# Patient Record
Sex: Male | Born: 1965 | Race: White | Hispanic: No | Marital: Married | Smoking: Never smoker
Health system: Southern US, Community
[De-identification: ages and names within clinical notes are randomized; demographics above are authoritative.]

## PROBLEM LIST (undated history)

## (undated) DIAGNOSIS — E785 Hyperlipidemia, unspecified: Secondary | ICD-10-CM

## (undated) DIAGNOSIS — M1990 Unspecified osteoarthritis, unspecified site: Secondary | ICD-10-CM

## (undated) DIAGNOSIS — R011 Cardiac murmur, unspecified: Secondary | ICD-10-CM

## (undated) DIAGNOSIS — I10 Essential (primary) hypertension: Secondary | ICD-10-CM

## (undated) HISTORY — PX: KNEE ARTHROSCOPY: SUR90

---

## 2008-09-22 ENCOUNTER — Emergency Department: Payer: Self-pay | Admitting: Internal Medicine

## 2008-09-23 ENCOUNTER — Emergency Department: Payer: Self-pay | Admitting: Emergency Medicine

## 2009-08-26 IMAGING — CT CT STONE STUDY
1 of 2 series · 15 of 32 positions shown, 19 images · non-contrast
Comparison: None

REASON FOR EXAM: pain l flank
COMMENTS:

PROCEDURE:     CT  - CT ABDOMEN /PELVIS WO (STONE)  - September 22, 2008 [DATE]
RESULT:     Indication: Left flank pain
TECHNIQUE: Multiple axial images from the lung bases to the symphysis pubis
were obtained without oral or intravenous contrast.

[Series 2: soft tissue · axial · 0.73mm/px · z∈[-163,+263]mm · 15 of 160 slices shown, 19 images]
[im 12/160  soft-tissue]
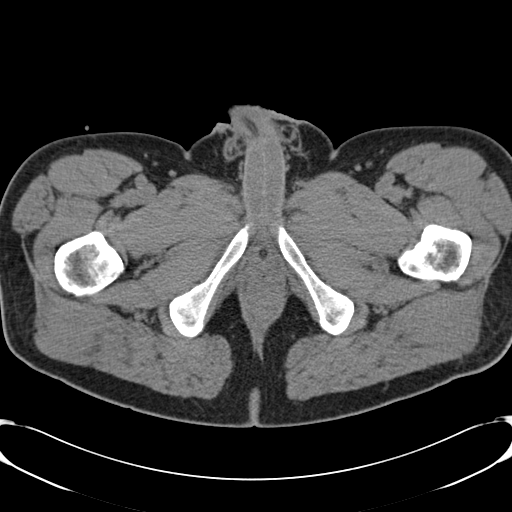
[im 12/160  bone]
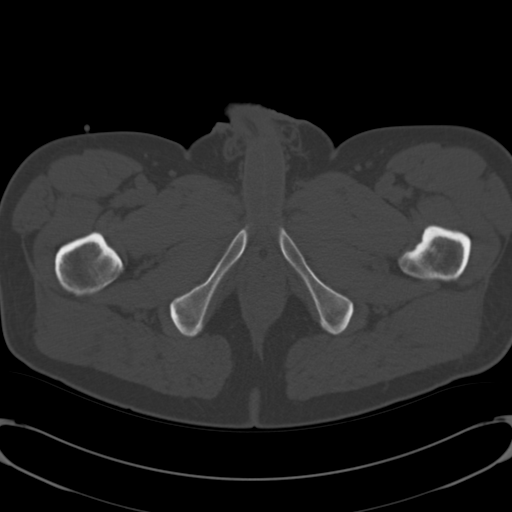
[im 24/160  soft-tissue]
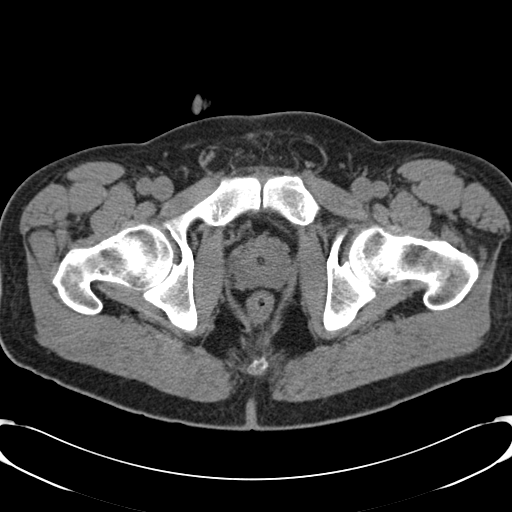
[im 36/160  soft-tissue]
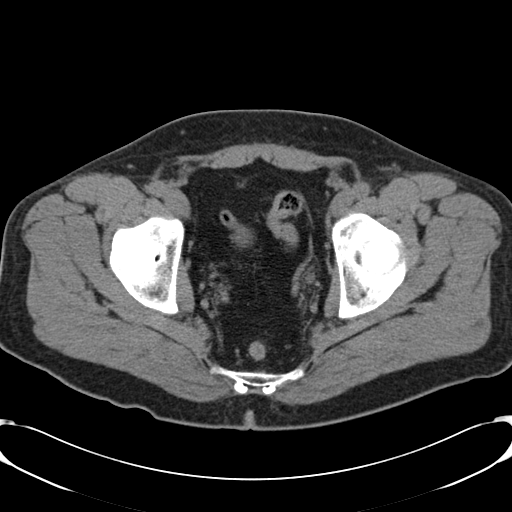
[im 48/160  soft-tissue]
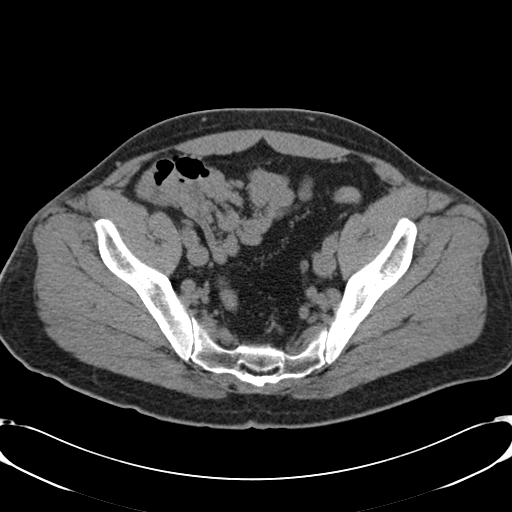
[im 59/160  soft-tissue]
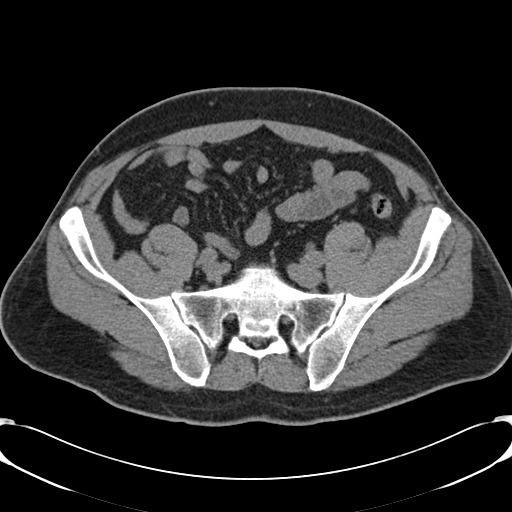
[im 71/160  soft-tissue]
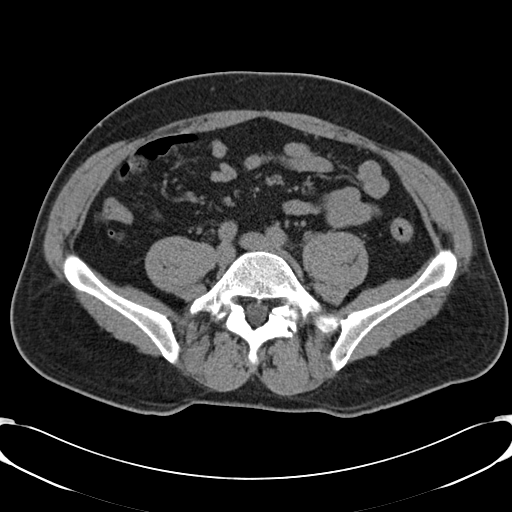
[im 83/160  soft-tissue]
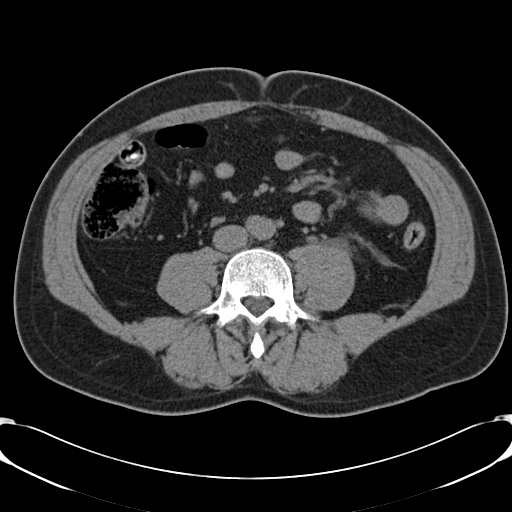
[im 95/160  soft-tissue]
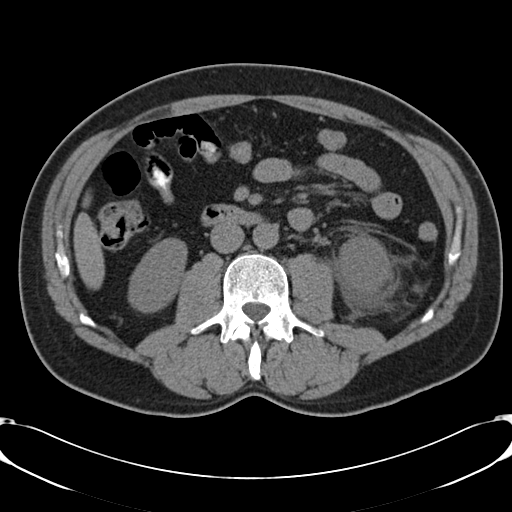
[im 107/160  soft-tissue]
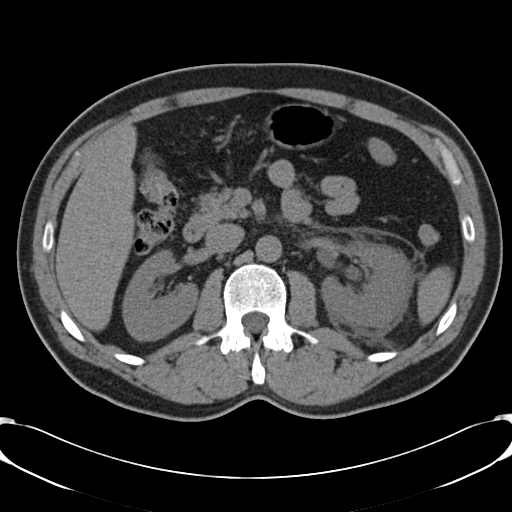
[im 107/160  bone]
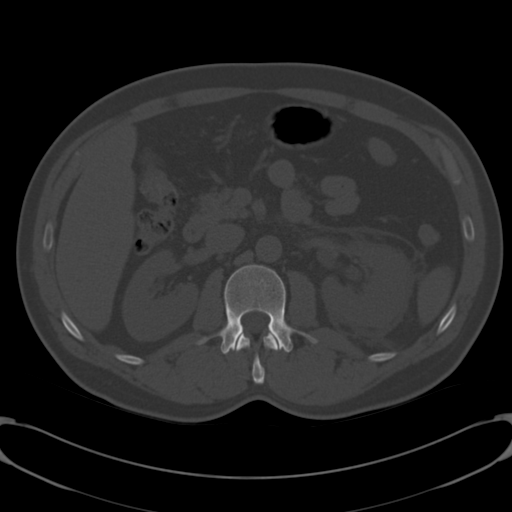
[im 118/160  soft-tissue]
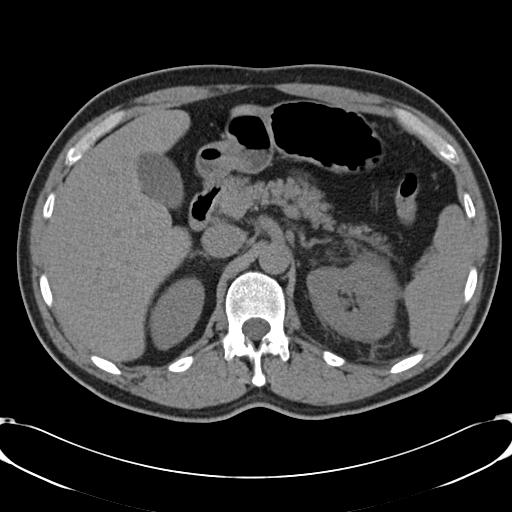
[im 130/160  soft-tissue]
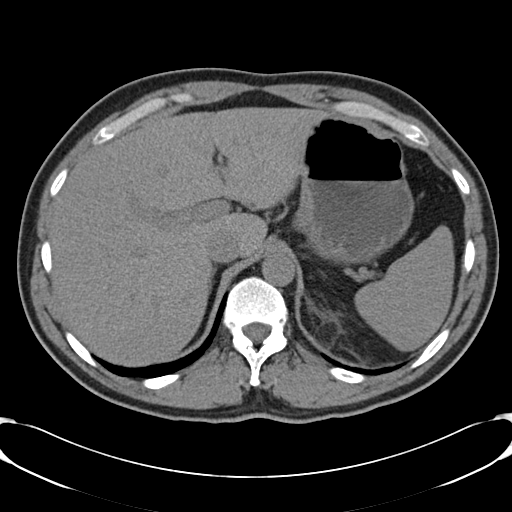
[im 136/160  lung]
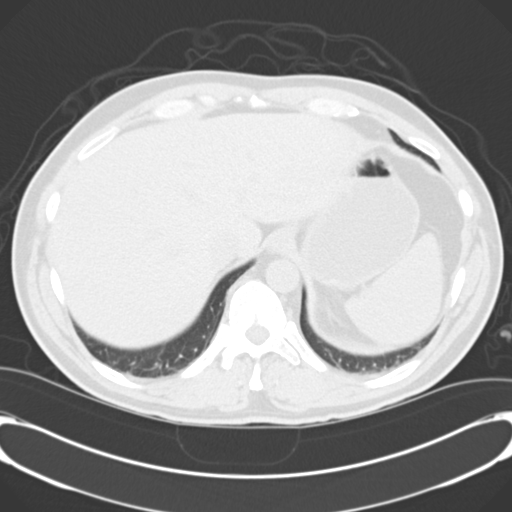
[im 142/160  soft-tissue]
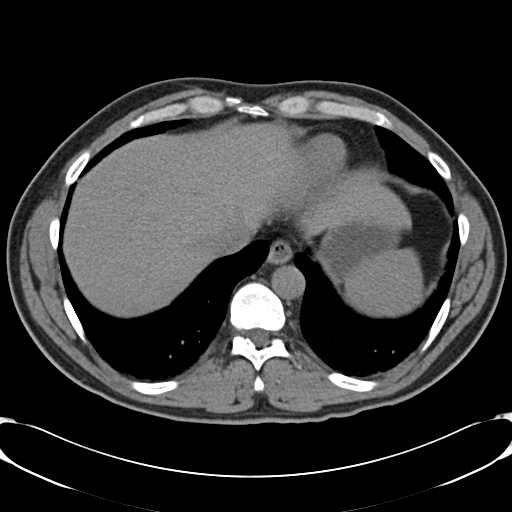
[im 142/160  lung]
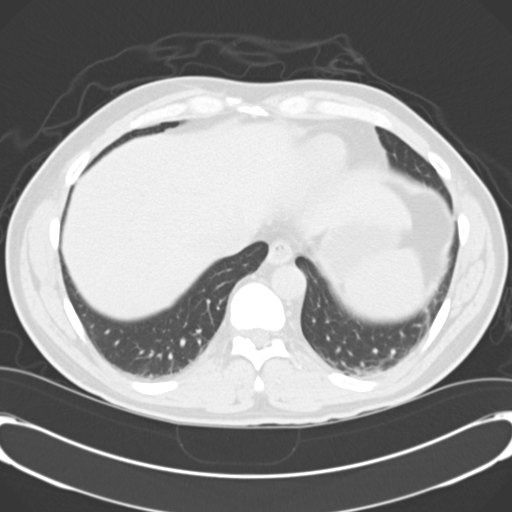
[im 148/160  lung]
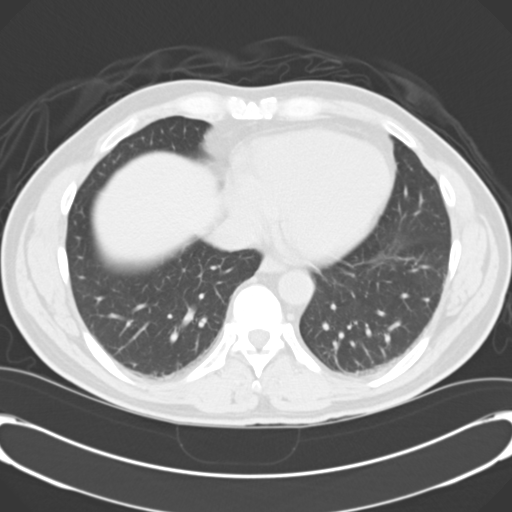
[im 154/160  soft-tissue]
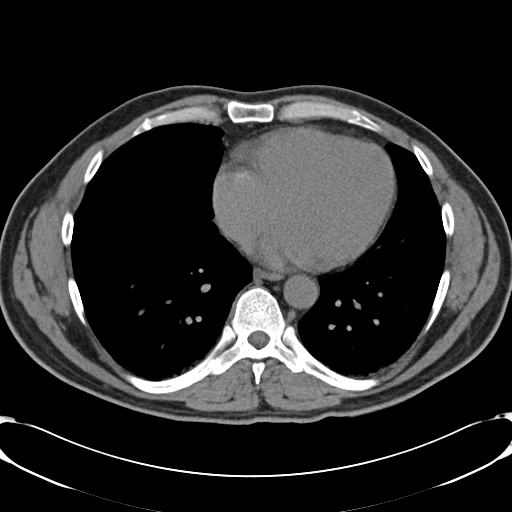
[im 154/160  lung]
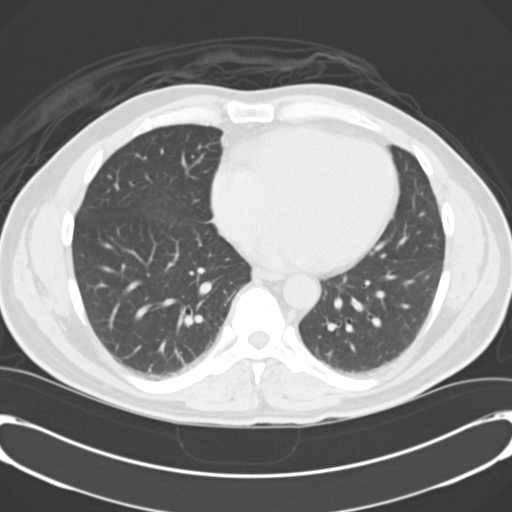

[15 of 32 positions shown; findings below may reference images not displayed]

FINDINGS: The lung bases are clear. There is no pleural or pericardial effusions.

There is a 4 mm left ureterovesicular junction calculus with mild
hydroureter and mild hydronephrosis. There is a significant amount of
perinephric stranding. There is no focal fluid collection to suggest an
abscess.

The right kidney is unremarkable. There is a punctate nonobstructing right
renal calculus. There is no right ureteral calculus. There is a Foley
catheter present within the bladder.

The liver demonstrates no focal abnormality. The gallbladder is
unremarkable. The spleen demonstrates no focal abnormality. The adrenal
glands and pancreas are normal.

The unopacified stomach, duodenum, small intestine, and large intestine are
unremarkable, but evaluation is limited by lack of oral contrast. A normal
caliber appendix is visualized in the right lower quadrant without
periappendiceal inflammatory changes. There is no pneumoperitoneum,
pneumatosis, or portal venous gas. There is no abdominal or pelvic free
fluid. There is no lymphadenopathy.

The abdominal aorta is normal in caliber.

There are mild degenerative changes of the right SI joint..
IMPRESSION: 1. There is a 4 mm left ureterovesicular junction calculus with mild
hydroureter and mild hydronephrosis. There is a significant amount of
perinephric stranding; correlate with urinalysis for pyelonephritis. There
is no focal fluid collection to suggest an abscess.

## 2016-01-12 ENCOUNTER — Ambulatory Visit: Payer: 59 | Attending: Specialist

## 2016-01-12 ENCOUNTER — Ambulatory Visit: Payer: Self-pay

## 2016-01-12 DIAGNOSIS — R0683 Snoring: Secondary | ICD-10-CM | POA: Diagnosis present

## 2016-01-12 DIAGNOSIS — G4761 Periodic limb movement disorder: Secondary | ICD-10-CM | POA: Insufficient documentation

## 2016-01-12 DIAGNOSIS — G4733 Obstructive sleep apnea (adult) (pediatric): Secondary | ICD-10-CM | POA: Diagnosis not present

## 2016-01-12 DIAGNOSIS — I10 Essential (primary) hypertension: Secondary | ICD-10-CM | POA: Diagnosis present

## 2019-03-26 ENCOUNTER — Other Ambulatory Visit: Payer: Self-pay

## 2019-03-30 ENCOUNTER — Other Ambulatory Visit
Admission: RE | Admit: 2019-03-30 | Discharge: 2019-03-30 | Disposition: A | Discharge status: Home / Self Care | Payer: 59 | Source: Ambulatory Visit | Attending: Orthopedic Surgery | Admitting: Orthopedic Surgery

## 2019-03-30 ENCOUNTER — Other Ambulatory Visit: Payer: Self-pay

## 2019-03-30 ENCOUNTER — Ambulatory Visit: Payer: Self-pay | Admitting: Orthopedic Surgery

## 2019-03-30 DIAGNOSIS — Z1159 Encounter for screening for other viral diseases: Secondary | ICD-10-CM | POA: Insufficient documentation

## 2019-03-30 DIAGNOSIS — Z01812 Encounter for preprocedural laboratory examination: Secondary | ICD-10-CM | POA: Diagnosis present

## 2019-03-31 LAB — NOVEL CORONAVIRUS, NAA (HOSP ORDER, SEND-OUT TO REF LAB; TAT 18-24 HRS): SARS-CoV-2, NAA: NOT DETECTED

## 2019-04-02 ENCOUNTER — Encounter
Admission: RE | Disposition: A | Discharge status: Home / Self Care | Payer: Self-pay | Source: Home / Self Care | Attending: Orthopedic Surgery

## 2019-04-02 ENCOUNTER — Ambulatory Visit: Payer: 59 | Admitting: Anesthesiology

## 2019-04-02 ENCOUNTER — Ambulatory Visit
Admission: RE | Admit: 2019-04-02 | Discharge: 2019-04-02 | Disposition: A | Discharge status: Home / Self Care | Payer: 59 | Attending: Orthopedic Surgery | Admitting: Orthopedic Surgery

## 2019-04-02 ENCOUNTER — Other Ambulatory Visit: Payer: Self-pay

## 2019-04-02 DIAGNOSIS — I10 Essential (primary) hypertension: Secondary | ICD-10-CM | POA: Insufficient documentation

## 2019-04-02 DIAGNOSIS — S83241A Other tear of medial meniscus, current injury, right knee, initial encounter: Secondary | ICD-10-CM | POA: Diagnosis not present

## 2019-04-02 DIAGNOSIS — S83281A Other tear of lateral meniscus, current injury, right knee, initial encounter: Secondary | ICD-10-CM | POA: Insufficient documentation

## 2019-04-02 DIAGNOSIS — Z79899 Other long term (current) drug therapy: Secondary | ICD-10-CM | POA: Diagnosis not present

## 2019-04-02 DIAGNOSIS — E7800 Pure hypercholesterolemia, unspecified: Secondary | ICD-10-CM | POA: Insufficient documentation

## 2019-04-02 DIAGNOSIS — M2241 Chondromalacia patellae, right knee: Secondary | ICD-10-CM | POA: Insufficient documentation

## 2019-04-02 DIAGNOSIS — X58XXXA Exposure to other specified factors, initial encounter: Secondary | ICD-10-CM | POA: Insufficient documentation

## 2019-04-02 HISTORY — PX: KNEE ARTHROSCOPY: SHX127

## 2019-04-02 HISTORY — DX: Essential (primary) hypertension: I10

## 2019-04-02 HISTORY — DX: Hyperlipidemia, unspecified: E78.5

## 2019-04-02 HISTORY — DX: Cardiac murmur, unspecified: R01.1

## 2019-04-02 HISTORY — DX: Unspecified osteoarthritis, unspecified site: M19.90

## 2019-04-02 SURGERY — ARTHROSCOPY, KNEE
Anesthesia: General | Site: Knee | Laterality: Right

## 2019-04-02 MED ORDER — HYDROCODONE-ACETAMINOPHEN 5-325 MG PO TABS: 1.0000 | ORAL_TABLET | ORAL | 0 refills | Status: AC | PRN

## 2019-04-02 MED ORDER — LIDOCAINE HCL (CARDIAC) PF 100 MG/5ML IV SOSY
PREFILLED_SYRINGE | INTRAVENOUS | Status: DC | PRN
  Administered 2019-04-02: 30 mg via INTRATRACHEAL

## 2019-04-02 MED ORDER — OXYCODONE HCL 5 MG/5ML PO SOLN: 5.0000 mg | Freq: Once | ORAL | Status: DC | PRN

## 2019-04-02 MED ORDER — FENTANYL CITRATE (PF) 100 MCG/2ML IJ SOLN
INTRAMUSCULAR | Status: DC | PRN
  Administered 2019-04-02 (×4): 25 ug via INTRAVENOUS

## 2019-04-02 MED ORDER — CLINDAMYCIN PHOSPHATE 900 MG/50ML IV SOLN
900.0000 mg | INTRAVENOUS | Status: AC
  Administered 2019-04-02: 900 mg via INTRAVENOUS

## 2019-04-02 MED ORDER — BUPIVACAINE-EPINEPHRINE 0.5% -1:200000 IJ SOLN
INTRAMUSCULAR | Status: DC | PRN
  Administered 2019-04-02: 13 mL

## 2019-04-02 MED ORDER — ACETAMINOPHEN 160 MG/5ML PO SOLN: 325.0000 mg | ORAL | Status: DC | PRN

## 2019-04-02 MED ORDER — PROPOFOL 10 MG/ML IV BOLUS
INTRAVENOUS | Status: DC | PRN
  Administered 2019-04-02: 200 mg via INTRAVENOUS

## 2019-04-02 MED ORDER — CHLORHEXIDINE GLUCONATE 4 % EX LIQD: 60.0000 mL | Freq: Once | CUTANEOUS | Status: DC

## 2019-04-02 MED ORDER — ACETAMINOPHEN 325 MG PO TABS: 325.0000 mg | ORAL_TABLET | ORAL | Status: DC | PRN

## 2019-04-02 MED ORDER — ONDANSETRON HCL 4 MG/2ML IJ SOLN: 4.0000 mg | Freq: Once | INTRAMUSCULAR | Status: DC | PRN

## 2019-04-02 MED ORDER — OXYCODONE HCL 5 MG PO TABS: 5.0000 mg | ORAL_TABLET | Freq: Once | ORAL | Status: DC | PRN

## 2019-04-02 MED ORDER — FENTANYL CITRATE (PF) 100 MCG/2ML IJ SOLN: 25.0000 ug | INTRAMUSCULAR | Status: DC | PRN

## 2019-04-02 MED ORDER — LACTATED RINGERS IV SOLN
INTRAVENOUS | Status: DC
  Administered 2019-04-02: 13:00:00 via INTRAVENOUS

## 2019-04-02 MED ORDER — GLYCOPYRROLATE 0.2 MG/ML IJ SOLN
INTRAMUSCULAR | Status: DC | PRN
  Administered 2019-04-02: 0.1 mg via INTRAVENOUS

## 2019-04-02 MED ORDER — DEXAMETHASONE SODIUM PHOSPHATE 4 MG/ML IJ SOLN
INTRAMUSCULAR | Status: DC | PRN
  Administered 2019-04-02: 4 mg via INTRAVENOUS

## 2019-04-02 MED ORDER — ACETAMINOPHEN 500 MG PO TABS
1000.0000 mg | ORAL_TABLET | Freq: Once | ORAL | Status: AC
  Administered 2019-04-02: 1000 mg via ORAL

## 2019-04-02 MED ORDER — ROPIVACAINE HCL 5 MG/ML IJ SOLN
INTRAMUSCULAR | Status: DC | PRN
  Administered 2019-04-02: 9 mL

## 2019-04-02 MED ORDER — MIDAZOLAM HCL 5 MG/5ML IJ SOLN
INTRAMUSCULAR | Status: DC | PRN
  Administered 2019-04-02: 2 mg via INTRAVENOUS

## 2019-04-02 MED ORDER — LACTATED RINGERS IV SOLN
INTRAVENOUS | Status: DC
  Administered 2019-04-02: 14:00:00 via INTRAVENOUS

## 2019-04-02 MED ORDER — DOCUSATE SODIUM 100 MG PO CAPS: 100.0000 mg | ORAL_CAPSULE | Freq: Every day | ORAL | 2 refills | Status: AC | PRN

## 2019-04-02 MED ORDER — CELECOXIB 400 MG PO CAPS
400.0000 mg | ORAL_CAPSULE | Freq: Once | ORAL | Status: AC
  Administered 2019-04-02: 13:00:00 400 mg via ORAL

## 2019-04-02 MED ORDER — ONDANSETRON HCL 4 MG/2ML IJ SOLN
INTRAMUSCULAR | Status: DC | PRN
  Administered 2019-04-02: 4 mg via INTRAVENOUS

## 2019-04-02 MED ORDER — TRIAMCINOLONE ACETONIDE 40 MG/ML IJ SUSP
INTRAMUSCULAR | Status: DC | PRN
  Administered 2019-04-02: 1 mL

## 2019-04-02 SURGICAL SUPPLY — 24 items
4.5mm INCISOR PLUS Blade ×2 IMPLANT
BANDAGE ELASTIC 6 LF NS (GAUZE/BANDAGES/DRESSINGS) ×5 IMPLANT
BLADE FULL RADIUS 3.5 (BLADE) ×1 IMPLANT
CHLORAPREP W/TINT 26 (MISCELLANEOUS) ×2 IMPLANT
COVER LIGHT HANDLE UNIVERSAL (MISCELLANEOUS) ×6 IMPLANT
DRAPE IMP U-DRAPE 54X76 (DRAPES) ×3 IMPLANT
GAUZE SPONGE 4X4 12PLY STRL (GAUZE/BANDAGES/DRESSINGS) ×3 IMPLANT
GLOVE BIO SURGEON STRL SZ8 (GLOVE) ×6 IMPLANT
GLOVE INDICATOR 8.0 STRL GRN (GLOVE) ×3 IMPLANT
GOWN STRL REUS W/ TWL LRG LVL3 (GOWN DISPOSABLE) ×1 IMPLANT
GOWN STRL REUS W/TWL LRG LVL3 (GOWN DISPOSABLE) ×2
IV LACTATED RINGER IRRG 3000ML (IV SOLUTION) ×4
IV LR IRRIG 3000ML ARTHROMATIC (IV SOLUTION) ×2 IMPLANT
KIT TURNOVER KIT A (KITS) ×3 IMPLANT
MANIFOLD 4PT FOR NEPTUNE1 (MISCELLANEOUS) ×3 IMPLANT
PACK ARTHROSCOPY KNEE (MISCELLANEOUS) ×3 IMPLANT
PAD WRAPON POLAR KNEE (MISCELLANEOUS) IMPLANT
STRAP BODY AND KNEE 60X3 (MISCELLANEOUS) ×3 IMPLANT
SUT ETHILON 4-0 (SUTURE) ×2
SUT ETHILON 4-0 FS2 18XMFL BLK (SUTURE) ×1
SUTURE ETHLN 4-0 FS2 18XMF BLK (SUTURE) IMPLANT
TUBING ARTHRO INFLOW-ONLY STRL (TUBING) ×3 IMPLANT
WAND WEREWOLF FLOW 90D (MISCELLANEOUS) ×2 IMPLANT
WRAPON POLAR PAD KNEE (MISCELLANEOUS) ×3

## 2019-04-02 NOTE — Transfer of Care (Signed)
Immediate Anesthesia Transfer of Care Note  Patient: Christopher Paul  Procedure(s) Performed: ARTHROSCOPY KNEE: Partial Medial and Lateral Menesectomy Chondroplasy Synovectomy (Right Knee)  Patient Location: PACU  Anesthesia Type: General LMA  Level of Consciousness: awake, alert  and patient cooperative  Airway and Oxygen Therapy: Patient Spontanous Breathing and Patient connected to supplemental oxygen  Post-op Assessment: Post-op Vital signs reviewed, Patient's Cardiovascular Status Stable, Respiratory Function Stable, Patent Airway and No signs of Nausea or vomiting  Post-op Vital Signs: Reviewed and stable  Complications: No apparent anesthesia complications

## 2019-04-02 NOTE — Anesthesia Preprocedure Evaluation (Signed)
Anesthesia Evaluation  Patient identified by MRN, date of birth, ID band Patient awake    Reviewed: Allergy & Precautions, H&P , NPO status , Patient's Chart, lab work & pertinent test results  Airway Mallampati: III  TM Distance: >3 FB Neck ROM: full    Dental no notable dental hx.    Pulmonary    Pulmonary exam normal breath sounds clear to auscultation       Cardiovascular hypertension, Normal cardiovascular exam Rhythm:regular Rate:Normal     Neuro/Psych    GI/Hepatic   Endo/Other    Renal/GU      Musculoskeletal   Abdominal   Peds  Hematology   Anesthesia Other Findings   Reproductive/Obstetrics                             Anesthesia Physical Anesthesia Plan  ASA: II  Anesthesia Plan: General LMA   Post-op Pain Management:    Induction:   PONV Risk Score and Plan: 2 and Ondansetron, Dexamethasone and Treatment may vary due to age or medical condition  Airway Management Planned:   Additional Equipment:   Intra-op Plan:   Post-operative Plan:   Informed Consent: I have reviewed the patients History and Physical, chart, labs and discussed the procedure including the risks, benefits and alternatives for the proposed anesthesia with the patient or authorized representative who has indicated his/her understanding and acceptance.       Plan Discussed with: CRNA  Anesthesia Plan Comments:         Anesthesia Quick Evaluation

## 2019-04-02 NOTE — H&P (Signed)
The patient has been re-examined, and the chart reviewed, and there have been no interval changes to the documented history and physical.  Plan a right knee arthroscopy today.  Anesthesia is not consulted regarding a peripheral nerve block for post-operative pain.  The risks, benefits, and alternatives have been discussed at length, and the patient is willing to proceed.     

## 2019-04-02 NOTE — Anesthesia Procedure Notes (Signed)
Procedure Name: LMA Insertion Date/Time: 04/02/2019 1:43 PM Performed by: Cameron Ali, CRNA Pre-anesthesia Checklist: Patient identified, Emergency Drugs available, Suction available, Timeout performed and Patient being monitored Patient Re-evaluated:Patient Re-evaluated prior to induction Oxygen Delivery Method: Circle system utilized Preoxygenation: Pre-oxygenation with 100% oxygen Induction Type: IV induction LMA: LMA inserted LMA Size: 4.0 Number of attempts: 1 Placement Confirmation: positive ETCO2 and breath sounds checked- equal and bilateral Tube secured with: Tape Dental Injury: Teeth and Oropharynx as per pre-operative assessment

## 2019-04-02 NOTE — Op Note (Signed)
  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 male with a diagnosis of TEAR OF MEDIAL MENISCUS who failed conservative management and elected for surgical management.    The risks benefits and alternatives were discussed with the patient preoperatively including the risks of infection, bleeding, nerve injury, knee stiffness, persistent pain, osteoarthritis and the need for further surgery. Medical  risks include DVT and pulmonary embolism, myocardial infarction, stroke, pneumonia, respiratory failure and death. The patient understood these risks and wished to proceed.   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 ACL and PCL were intact. There was NO lateral subluxation of the patella. No loose bodies were identified within the medial or lateral gutters.  OPERATIVE PROCEDURE: Patient was met in the preoperative area. The operative extremity was signed with my initials according the hospital's correct site of surgery protocol.  The patient was brought to the operating room where they was placed supine on the operative table. General anesthesia was administered. The patient was prepped and draped in a sterile fashion.  A timeout was performed to verify the patient's name, date of birth, medical record number, correct site of surgery correct procedure to  be performed. It was also used to verify the patient received antibiotics that all appropriate instruments, and radiographic studies were available in the room. Once all in attendance were in agreement, the case began.  Proposed arthroscopy incisions were drawn out with a surgical marker. These were pre-injected with 0.5% marcaine with epinephrine. An 11 blade was used to establish an inferior lateral and inferomedial portals. The inferomedial portal was created using a 18-gauge spinal needle under direct visualization.  A full diagnostic examination of the knee was performed including the suprapatellar pouch, patellofemoral joint, medial lateral compartments as well as the medial lateral gutters, the intercondylar notch in the posterior knee.  Patient had the medial and lateral meniscal tear treated with a 4-0 resector shaver blade and straight duckbill basket. The meniscus was debrided until a stable rim was achieved. A chondroplasty of the medial femoral condyle, trochlea and undersurface of patella was also performed using a 4-0 resector shaver blade. A partial synovectomy was also performed using a 4-0 resector shaver blade.  The knee was then copiously lavaged. All arthroscopic instruments were removed. The 2 arthroscopy portals were closed with 4-0 nylon. A dry sterile and compressive dressing was applied. The patient was brought to the PACU in stable condition. I was scrubbed and present for the entire case and all sharp and instrument counts were correct at the conclusion the case. I spoke with the patient's family postoperatively to let them know the case was performed without complication and the patient was stable in the recovery room.  Kurtis Bushman, MD

## 2019-04-02 NOTE — Anesthesia Postprocedure Evaluation (Signed)
Anesthesia Post Note  Patient: WHALEN TROMPETER  Procedure(s) Performed: ARTHROSCOPY KNEE: Partial Medial and Lateral Menesectomy Chondroplasy Synovectomy (Right Knee)  Patient location during evaluation: PACU Anesthesia Type: General Level of consciousness: awake and alert and oriented Pain management: satisfactory to patient Vital Signs Assessment: post-procedure vital signs reviewed and stable Respiratory status: spontaneous breathing, nonlabored ventilation and respiratory function stable Cardiovascular status: blood pressure returned to baseline and stable Postop Assessment: Adequate PO intake and No signs of nausea or vomiting Anesthetic complications: no    Raliegh Ip

## 2019-04-02 NOTE — Discharge Instructions (Signed)
Post Op Home Instructions for Knee Arthroscopy ° °1) Do not sit for longer than 1 hour at a time with your leg dangling down.  You should have your legs elevated (higher than your heart) in a recliner chair or couch. ° °2) You may be up walking around as tolerated but should take periodic breaks to elevate your legs.  Discontinue use of crutches when you feel you are able to walk without pain or a limp. ° °3) Work on gentle bending and straightening of the knee. ° °4) You may remove the Ace wrap and dressings two days after surgery.  Place band aids over the incision sites. ° °5) You may shower after you remove the surgical dressing.  You do not need to cover the incision with plastic wrap.  The incision can get wet, but do not submerge under water.  After your sutures have been removed, you should wait 24 hours before submerging incision under water. ° °6) Pain medication can cause constipation.  You should increase your fluid intake, increase your intake of high fiber foods and/or take Metamucil as needed for constipation. ° °7) Continue your physical therapy exercises, as shown at the office, at least twice daily.  You should set up outpatient physical therapy and start within the first week after surgery. ° °8) Continue to use your Polar Pack continuously for 2-3 days after surgery.  After you remove the surgical dressing, it is a good idea to use your Polar Pack or ice pack for 30 minutes after doing your exercises to reduce swelling. ° °9) Do not be surprised if you have increased pain at night.  This usually means you have been a little too active during the day and need to reduce your activities. ° °10) If you develop lower extremity swelling that does not improve after a night of elevation, please call the office.  This could be an early sign of a blood clot. ° °Please call with any questions at [phone] ° ° °General Anesthesia, Adult, Care After °This sheet gives you information about how to care for  yourself after your procedure. Your health care provider may also give you more specific instructions. If you have problems or questions, contact your health care provider. °What can I expect after the procedure? °After the procedure, the following side effects are common: °· Pain or discomfort at the IV site. °· Nausea. °· Vomiting. °· Sore throat. °· Trouble concentrating. °· Feeling cold or chills. °· Weak or tired. °· Sleepiness and fatigue. °· Soreness and body aches. These side effects can affect parts of the body that were not involved in surgery. °Follow these instructions at home: ° °For at least 24 hours after the procedure: °· Have a responsible adult stay with you. It is important to have someone help care for you until you are awake and alert. °· Rest as needed. °· Do not: °? Participate in activities in which you could fall or become injured. °? Drive. °? Use heavy machinery. °? Drink alcohol. °? Take sleeping pills or medicines that cause drowsiness. °? Make important decisions or sign legal documents. °? Take care of children on your own. °Eating and drinking °· Follow any instructions from your health care provider about eating or drinking restrictions. °· When you feel hungry, start by eating small amounts of foods that are soft and easy to digest (bland), such as toast. Gradually return to your regular diet. °· Drink enough fluid to keep your urine pale yellow. °·   If you vomit, rehydrate by drinking water, juice, or clear broth. °General instructions °· If you have sleep apnea, surgery and certain medicines can increase your risk for breathing problems. Follow instructions from your health care provider about wearing your sleep device: °? Anytime you are sleeping, including during daytime naps. °? While taking prescription pain medicines, sleeping medicines, or medicines that make you drowsy. °· Return to your normal activities as told by your health care provider. Ask your health care provider  what activities are safe for you. °· Take over-the-counter and prescription medicines only as told by your health care provider. °· If you smoke, do not smoke without supervision. °· Keep all follow-up visits as told by your health care provider. This is important. °Contact a health care provider if: °· You have nausea or vomiting that does not get better with medicine. °· You cannot eat or drink without vomiting. °· You have pain that does not get better with medicine. °· You are unable to pass urine. °· You develop a skin rash. °· You have a fever. °· You have redness around your IV site that gets worse. °Get help right away if: °· You have difficulty breathing. °· You have chest pain. °· You have blood in your urine or stool, or you vomit blood. °Summary °· After the procedure, it is common to have a sore throat or nausea. It is also common to feel tired. °· Have a responsible adult stay with you for the first 24 hours after general anesthesia. It is important to have someone help care for you until you are awake and alert. °· When you feel hungry, start by eating small amounts of foods that are soft and easy to digest (bland), such as toast. Gradually return to your regular diet. °· Drink enough fluid to keep your urine pale yellow. °· Return to your normal activities as told by your health care provider. Ask your health care provider what activities are safe for you. °This information is not intended to replace advice given to you by your health care provider. Make sure you discuss any questions you have with your health care provider. °Document Released: 12/24/2000 Document Revised: 09/20/2017 Document Reviewed: 05/03/2017 °[employer] Patient Education © 2020 [employer] ° °

## 2019-04-03 ENCOUNTER — Encounter: Payer: Self-pay | Admitting: Orthopedic Surgery
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
## 2024-09-17 ENCOUNTER — Other Ambulatory Visit: Payer: Self-pay | Admitting: Urology

## 2024-09-17 ENCOUNTER — Other Ambulatory Visit: Payer: Self-pay | Admitting: *Deleted

## 2024-09-17 DIAGNOSIS — R10A0 Flank pain, unspecified side: Secondary | ICD-10-CM

## 2024-09-17 DIAGNOSIS — Z87442 Personal history of urinary calculi: Secondary | ICD-10-CM

## 2024-09-18 ENCOUNTER — Ambulatory Visit
Admission: RE | Admit: 2024-09-18 | Discharge: 2024-09-18 | Disposition: A | Source: Ambulatory Visit | Attending: Urology | Admitting: Urology

## 2024-09-18 ENCOUNTER — Ambulatory Visit: Admitting: Urology

## 2024-09-18 ENCOUNTER — Encounter: Payer: Self-pay | Admitting: Urology

## 2024-09-18 ENCOUNTER — Ambulatory Visit
Admission: RE | Admit: 2024-09-18 | Discharge: 2024-09-18 | Disposition: A | Discharge status: Home / Self Care | Attending: Urology | Admitting: Urology

## 2024-09-18 ENCOUNTER — Other Ambulatory Visit: Payer: Self-pay | Admitting: Urology

## 2024-09-18 VITALS — BP 143/88 | HR 58 | Ht 70.0 in | Wt 238.0 lb

## 2024-09-18 DIAGNOSIS — N23 Unspecified renal colic: Secondary | ICD-10-CM

## 2024-09-18 DIAGNOSIS — N132 Hydronephrosis with renal and ureteral calculous obstruction: Secondary | ICD-10-CM | POA: Diagnosis not present

## 2024-09-18 DIAGNOSIS — R10A0 Flank pain, unspecified side: Secondary | ICD-10-CM

## 2024-09-18 DIAGNOSIS — Z87442 Personal history of urinary calculi: Secondary | ICD-10-CM | POA: Diagnosis present

## 2024-09-18 DIAGNOSIS — N200 Calculus of kidney: Secondary | ICD-10-CM

## 2024-09-18 DIAGNOSIS — N201 Calculus of ureter: Secondary | ICD-10-CM

## 2024-09-18 LAB — URINALYSIS, COMPLETE
Bilirubin, UA: NEGATIVE
Glucose, UA: NEGATIVE
Ketones, UA: NEGATIVE
Leukocytes,UA: NEGATIVE
Nitrite, UA: NEGATIVE
Protein,UA: NEGATIVE
Specific Gravity, UA: <1.005 — ABNORMAL LOW (ref 1.005–1.030)
Urobilinogen, Ur: 0.2 mg/dL (ref 0.2–1.0)
pH, UA: 6 (ref 5.0–7.5)

## 2024-09-18 LAB — MICROSCOPIC EXAMINATION
Bacteria, UA: NONE SEEN
Epithelial Cells (non renal): NONE SEEN /HPF (ref 0–10)

## 2024-09-18 MED ORDER — OXYCODONE HCL 5 MG PO TABS: 5.0000 mg | ORAL_TABLET | Freq: Four times a day (QID) | ORAL | 0 refills | Status: AC | PRN

## 2024-09-18 NOTE — Progress Notes (Addendum)
 "  09/18/2024 9:03 AM   Christopher Paul May 30, 1966 [id number]  Referring provider: Auston Reyes BIRCH, MD [address] [facility],  [place] [address]  Chief Complaint  Patient presents with   Nephrolithiasis    HPI: Christopher Paul is a 58 y.o. male added onto today's scheduled for evaluation of right renal colic  Prior history of stone disease.  On 09/14/2024 he had onset of right flank pain Contacted PCP and was prescribed tramadol and tamsulosin He had intermittent pain the following day but no pain on 12/17 He had significant increase in his pain yesterday.  Last night his pain was severe with some radiation to the buttock region. Complains of urinary frequency, urgency, hesitancy and penile discomfort.  No fever, + nausea without vomiting Pain has improved this morning   PMH: Past Medical History:  Diagnosis Date   Arthritis    osteo of knee   Heart murmur    when younger, 26 or 58 yrs old   Hyperlipidemia    Hypertension    controlled on meds    Surgical History: Past Surgical History:  Procedure Laterality Date   KNEE ARTHROSCOPY Left    meniscal tear   KNEE ARTHROSCOPY Right 04/02/2019   Procedure: ARTHROSCOPY KNEE: Partial Medial and Lateral Menesectomy Chondroplasy Synovectomy;  Surgeon: Christopher Lynwood SAUNDERS, MD;  Location: [facility];  Service: Orthopedics;  Laterality: Right;    Home Medications:  Allergies as of 09/18/2024       Reactions   Penicillins    As a child        Medication List        Accurate as of September 18, 2024  9:03 AM. If you have any questions, ask your nurse or doctor.          aspirin EC 81 MG tablet Take 81 mg by mouth daily. am   atorvastatin 10 MG tablet Commonly known as: LIPITOR Take 10 mg by mouth daily. am   benazepril 40 MG tablet Commonly known as: LOTENSIN Take 40 mg by mouth daily. am   cyanocobalamin 1000 MCG tablet Commonly known as: VITAMIN B12 Take 1,000 mcg by mouth  daily.   Fish Oil 1000 MG Caps Take 3,000 mg by mouth.   vitamin C 1000 MG tablet Take 3,000 mg by mouth daily.        Allergies: Allergies[1]  Family History: No family history on file.  Social History:  reports that he has never smoked. He has quit using smokeless tobacco.  His smokeless tobacco use included snuff and chew. He reports current alcohol use of about 6.0 standard drinks of alcohol per week. He reports that he does not use drugs.   Physical Exam: BP (!) 143/88   Pulse (!) 58   Ht 5' 10 (1.778 m)   Wt 238 lb (108 kg)   BMI 34.15 kg/m   Constitutional:  Alert, No acute distress. HEENT: [place] AT Respiratory: Normal respiratory effort, no increased work of breathing. Psychiatric: Normal mood and affect.  Pertinent Imaging: KUB performed prior to this morning's visit was personally reviewed and interpreted.  There is a crescent shaped calcification overlying the lower portion of the left renal outline.  No abnormal right sided calcifications are identified  Assessment & Plan:   58 y.o. male with a prior history of stone disease with 24-hour history of severe right flank/low back pain with some radiation to the buttock He does have bothersome lower urinary tract symptoms  No definite right-sided stone seen on CT Urinalysis ordered Atypical presentation with radiation to buttock though does have voiding symptoms Recommend further evaluation with CT renal stone study today  Addendum: Urinalysis showed 1+ blood on dipstick and no significant RBCs or WBCs on microscopy CT renal stone study shows a nonobstructing 7 mm left lower pole calculus and mild right hydronephrosis/hydroureter secondary to a 2 mm UVJ calculus. We discussed there is a >90% chance this stone will pass.  Continue pain medication as needed.  Rx oxycodone  IR sent to pharmacy.  Continue tamsulosin and he is able to take NSAIDs which I would recommend taking at least every 12 hours Recommend annual  follow-up with KUB for monitoring of his left renal calculus   Christopher JAYSON Barba, MD  [facility] [address] [place], [place] [address] [phone]     [1]  Allergies Allergen Reactions   Penicillins     As a child   "
